# Patient Record
Sex: Female | Born: 1945 | Race: White | Hispanic: No | Marital: Married | Smoking: Former smoker
Health system: Southern US, Community
[De-identification: ages and names within clinical notes are randomized; demographics above are authoritative.]

## PROBLEM LIST (undated history)

## (undated) DIAGNOSIS — G4752 REM sleep behavior disorder: Secondary | ICD-10-CM

## (undated) DIAGNOSIS — I10 Essential (primary) hypertension: Secondary | ICD-10-CM

## (undated) HISTORY — PX: PARTIAL HIP ARTHROPLASTY: SHX733

## (undated) HISTORY — PX: HIP SURGERY: SHX245

---

## 2020-09-15 ENCOUNTER — Emergency Department (HOSPITAL_BASED_OUTPATIENT_CLINIC_OR_DEPARTMENT_OTHER)
Admission: EM | Admit: 2020-09-15 | Discharge: 2020-09-15 | Disposition: A | Discharge status: Home / Self Care | Payer: Medicare HMO | Attending: Emergency Medicine | Admitting: Emergency Medicine

## 2020-09-15 ENCOUNTER — Other Ambulatory Visit: Payer: Self-pay

## 2020-09-15 ENCOUNTER — Encounter (HOSPITAL_BASED_OUTPATIENT_CLINIC_OR_DEPARTMENT_OTHER): Payer: Self-pay

## 2020-09-15 ENCOUNTER — Emergency Department (HOSPITAL_BASED_OUTPATIENT_CLINIC_OR_DEPARTMENT_OTHER): Payer: Medicare HMO

## 2020-09-15 DIAGNOSIS — I10 Essential (primary) hypertension: Secondary | ICD-10-CM | POA: Insufficient documentation

## 2020-09-15 DIAGNOSIS — S92154A Nondisplaced avulsion fracture (chip fracture) of right talus, initial encounter for closed fracture: Secondary | ICD-10-CM | POA: Diagnosis not present

## 2020-09-15 DIAGNOSIS — W07XXXA Fall from chair, initial encounter: Secondary | ICD-10-CM | POA: Insufficient documentation

## 2020-09-15 DIAGNOSIS — S99912A Unspecified injury of left ankle, initial encounter: Secondary | ICD-10-CM | POA: Diagnosis present

## 2020-09-15 DIAGNOSIS — Z87891 Personal history of nicotine dependence: Secondary | ICD-10-CM | POA: Insufficient documentation

## 2020-09-15 DIAGNOSIS — M25571 Pain in right ankle and joints of right foot: Secondary | ICD-10-CM | POA: Insufficient documentation

## 2020-09-15 HISTORY — DX: REM sleep behavior disorder: G47.52

## 2020-09-15 HISTORY — DX: Essential (primary) hypertension: I10

## 2020-09-15 NOTE — ED Triage Notes (Signed)
Pt states was trying to step on a chair to reach something on a shelf. States fell and injured right foot/ankle. Denies hitting head/LOC.

## 2020-09-15 NOTE — ED Provider Notes (Signed)
[facility] EMERGENCY DEPARTMENT Provider Note  CSN: [id number] Arrival date & time: 09/15/20 1143    History Chief Complaint  Patient presents with   Jamie Caldwell    Jamie Caldwell is a 75 y.o. female with no significant PMH reports she was on a chair trying to reach something on a high shelf when the chair tipped over and she fell twisting her R ankle/foot. Denies any head injury or LOC. No other complaints of pain. Moderate aching pain in dorsal R foot worse with movement.    Past Medical History:  Diagnosis Date   Hypertension    REM sleep behavior disorder       No family history on file.  Social History   Tobacco Use   Smoking status: Former    Types: Cigarettes   Smokeless tobacco: Never  Vaping Use   Vaping Use: Never used  Substance Use Topics   Alcohol use: Yes    Comment: occasional   Drug use: Never     Home Medications Prior to Admission medications   Not on File     Allergies    Ace inhibitors   Review of Systems   Review of Systems A comprehensive review of systems was completed and negative except as noted in HPI.    Physical Exam BP (!) 167/77 (BP Location: Right Arm)   Pulse 74   Temp 97.7 F (36.5 C) (Oral)   Resp 18   Ht 5\' 4"  (1.626 m)   Wt 68 kg   SpO2 99%   BMI 25.75 kg/m   Physical Exam Vitals and nursing note reviewed.  HENT:     Head: Normocephalic and atraumatic.     Nose: Nose normal.  Eyes:     Extraocular Movements: Extraocular movements intact.  Pulmonary:     Effort: Pulmonary effort is normal.  Musculoskeletal:        General: Tenderness (dorsal R foot, no tenderness over malleoli or base of 5th metatarsal) present. Normal range of motion.     Cervical back: Neck supple.  Skin:    Findings: No rash (on exposed skin).  Neurological:     Mental Status: She is alert and oriented to person, place, and time.  Psychiatric:        Mood and Affect: Mood normal.     ED Results / Procedures / Treatments    Labs (all labs ordered are listed, but only abnormal results are displayed) Labs Reviewed - No data to display  EKG None   Radiology DG Ankle Complete Right  Result Date: 09/15/2020 CLINICAL DATA:  Fall, ankle injury EXAM: RIGHT ANKLE - COMPLETE 3+ VIEW; RIGHT FOOT COMPLETE - 3+ VIEW COMPARISON:  None. FINDINGS: Suspect a small avulsion fracture of the dorsal talus seen on lateral view. No other evidence of fracture or dislocation of the right foot or ankle. Joint spaces are preserved. Soft tissue edema of the anterior ankle. IMPRESSION: 1. Suspect a small avulsion fracture of the dorsal talus seen on lateral view. 2. No other evidence of fracture or dislocation of the right foot or ankle. 3.  Soft tissue edema of the anterior ankle. Electronically Signed   By: 11/15/2020 M.D.   On: 09/15/2020 12:46   DG Foot Complete Right  Result Date: 09/15/2020 CLINICAL DATA:  Fall, ankle injury EXAM: RIGHT ANKLE - COMPLETE 3+ VIEW; RIGHT FOOT COMPLETE - 3+ VIEW COMPARISON:  None. FINDINGS: Suspect a small avulsion fracture of the dorsal talus seen on lateral view.  No other evidence of fracture or dislocation of the right foot or ankle. Joint spaces are preserved. Soft tissue edema of the anterior ankle. IMPRESSION: 1. Suspect a small avulsion fracture of the dorsal talus seen on lateral view. 2. No other evidence of fracture or dislocation of the right foot or ankle. 3.  Soft tissue edema of the anterior ankle. Electronically Signed   By: Lauralyn Primes M.D.   On: 09/15/2020 12:46    Procedures Procedures  Medications Ordered in the ED Medications - No data to display   MDM Rules/Calculators/A&P MDM Xray images reviewed, tender over the area of concern for talar avulsion fracture. CAM walker for comfort and mobility. APAP for pain. Ortho follow up.   ED Course  I have reviewed the triage vital signs and the nursing notes.  Pertinent labs & imaging results that were available during my care of the  patient were reviewed by me and considered in my medical decision making (see chart for details).     Final Clinical Impression(s) / ED Diagnoses Final diagnoses:  Closed nondisplaced avulsion fracture of right talus, initial encounter    Rx / DC Orders ED Discharge Orders     None        Pollyann Savoy, MD 09/15/20 1308

## 2021-04-04 ENCOUNTER — Encounter (HOSPITAL_BASED_OUTPATIENT_CLINIC_OR_DEPARTMENT_OTHER): Payer: Self-pay

## 2021-04-04 ENCOUNTER — Emergency Department (HOSPITAL_BASED_OUTPATIENT_CLINIC_OR_DEPARTMENT_OTHER): Payer: Medicare HMO

## 2021-04-04 ENCOUNTER — Other Ambulatory Visit: Payer: Self-pay

## 2021-04-04 ENCOUNTER — Emergency Department (HOSPITAL_BASED_OUTPATIENT_CLINIC_OR_DEPARTMENT_OTHER)
Admission: EM | Admit: 2021-04-04 | Discharge: 2021-04-04 | Disposition: A | Discharge status: Home / Self Care | Payer: Medicare HMO | Attending: Emergency Medicine | Admitting: Emergency Medicine

## 2021-04-04 DIAGNOSIS — M25552 Pain in left hip: Secondary | ICD-10-CM | POA: Diagnosis present

## 2021-04-04 DIAGNOSIS — W19XXXA Unspecified fall, initial encounter: Secondary | ICD-10-CM | POA: Diagnosis not present

## 2021-04-04 DIAGNOSIS — I10 Essential (primary) hypertension: Secondary | ICD-10-CM | POA: Insufficient documentation

## 2021-04-04 DIAGNOSIS — R42 Dizziness and giddiness: Secondary | ICD-10-CM | POA: Insufficient documentation

## 2021-04-04 LAB — CBC WITH DIFFERENTIAL/PLATELET
Abs Immature Granulocytes: 0.02 10*3/uL (ref 0.00–0.07)
Basophils Absolute: 0 10*3/uL (ref 0.0–0.1)
Basophils Relative: 1 %
Eosinophils Absolute: 0.2 10*3/uL (ref 0.0–0.5)
Eosinophils Relative: 2 %
HCT: 41.4 % (ref 36.0–46.0)
Hemoglobin: 13.9 g/dL (ref 12.0–15.0)
Immature Granulocytes: 0 %
Lymphocytes Relative: 33 %
Lymphs Abs: 2.3 10*3/uL (ref 0.7–4.0)
MCH: 31.6 pg (ref 26.0–34.0)
MCHC: 33.6 g/dL (ref 30.0–36.0)
MCV: 94.1 fL (ref 80.0–100.0)
Monocytes Absolute: 0.6 10*3/uL (ref 0.1–1.0)
Monocytes Relative: 8 %
Neutro Abs: 3.9 10*3/uL (ref 1.7–7.7)
Neutrophils Relative %: 56 %
Platelets: 287 10*3/uL (ref 150–400)
RBC: 4.4 MIL/uL (ref 3.87–5.11)
RDW: 13.3 % (ref 11.5–15.5)
WBC: 6.9 10*3/uL (ref 4.0–10.5)
nRBC: 0 % (ref 0.0–0.2)

## 2021-04-04 LAB — COMPREHENSIVE METABOLIC PANEL
ALT: 15 U/L (ref 0–44)
AST: 22 U/L (ref 15–41)
Albumin: 3.6 g/dL (ref 3.5–5.0)
Alkaline Phosphatase: 72 U/L (ref 38–126)
Anion gap: 8 (ref 5–15)
BUN: 15 mg/dL (ref 8–23)
CO2: 28 mmol/L (ref 22–32)
Calcium: 9.2 mg/dL (ref 8.9–10.3)
Chloride: 103 mmol/L (ref 98–111)
Creatinine, Ser: 0.92 mg/dL (ref 0.44–1.00)
GFR, Estimated: >60 mL/min (ref >60)
Glucose, Bld: 104 mg/dL — ABNORMAL HIGH (ref 70–99)
Potassium: 3.3 mmol/L — ABNORMAL LOW (ref 3.5–5.1)
Sodium: 139 mmol/L (ref 135–145)
Total Bilirubin: 0.7 mg/dL (ref 0.3–1.2)
Total Protein: 6.5 g/dL (ref 6.5–8.1)

## 2021-04-04 LAB — URINALYSIS, ROUTINE W REFLEX MICROSCOPIC
Glucose, UA: NEGATIVE mg/dL
Hgb urine dipstick: NEGATIVE
Ketones, ur: NEGATIVE mg/dL
Nitrite: NEGATIVE
Protein, ur: 30 mg/dL — AB
Specific Gravity, Urine: 1.02 (ref 1.005–1.030)
pH: 6 (ref 5.0–8.0)

## 2021-04-04 LAB — URINALYSIS, MICROSCOPIC (REFLEX)

## 2021-04-04 MED ORDER — SODIUM CHLORIDE 0.9 % IV SOLN: INTRAVENOUS | Status: DC

## 2021-04-04 MED ORDER — SODIUM CHLORIDE 0.9 % IV BOLUS
1000.0000 mL | Freq: Once | INTRAVENOUS | Status: AC
  Administered 2021-04-04: 1000 mL via INTRAVENOUS

## 2021-04-04 NOTE — Discharge Instructions (Addendum)
Ask your primary care provider for a referral to a neurologist within the [facility] system.  Use compression hose to help keep blood pressure stable.

## 2021-04-04 NOTE — ED Notes (Signed)
Patient transported to CT/XRAY 

## 2021-04-04 NOTE — ED Provider Notes (Signed)
[facility] EMERGENCY DEPARTMENT Provider Note   CSN: [id number] Arrival date & time: 04/04/21  1143     History  Chief Complaint  Patient presents with   Marletta Lor    Jamie Caldwell is a 76 y.o. female.  Pt is a 76 yo wf with a hx of htn.  Pt has also had a hx of dizziness with intermittent falls.  She has seen cardiology and had a zio patch and echo eval which have been normal.  Pt had a fall in November which resulted in a left hip fx.  Pt said she gets these "spells" where she feels lightheaded.  She normally can try to get to a chair to lie down. Sx better after she sits.  Last night, she felt like she was going to pass out again.  She thought the floor was closer than it was and when she went to sit down, she misjudged the distance and fell again on her hip.  She is worried she injured some of the hardware.  Pt's pcp is aware of this dizziness and has encouraged her to use her cane and to continue with PT.       Home Medications Prior to Admission medications   Not on File      Allergies    Ace inhibitors    Review of Systems   Review of Systems  Musculoskeletal:        Left hip pain  Neurological:  Positive for dizziness.  All other systems reviewed and are negative.  Physical Exam Updated Vital Signs BP (!) 148/71    Pulse 73    Temp 97.7 F (36.5 C) (Oral)    Resp 15    Ht 5\' 4"  (1.626 m)    Wt 68 kg    SpO2 100%    BMI 25.75 kg/m  Physical Exam Vitals and nursing note reviewed.  Constitutional:      Appearance: Normal appearance.  HENT:     Head: Normocephalic and atraumatic.     Right Ear: External ear normal.     Left Ear: External ear normal.     Nose: Nose normal.     Mouth/Throat:     Mouth: Mucous membranes are moist.     Pharynx: Oropharynx is clear.  Eyes:     Extraocular Movements: Extraocular movements intact.     Conjunctiva/sclera: Conjunctivae normal.     Pupils: Pupils are equal, round, and reactive to light.  Cardiovascular:      Rate and Rhythm: Normal rate and regular rhythm.     Pulses: Normal pulses.     Heart sounds: Normal heart sounds.  Pulmonary:     Effort: Pulmonary effort is normal.     Breath sounds: Normal breath sounds.  Abdominal:     General: Abdomen is flat. Bowel sounds are normal.     Palpations: Abdomen is soft.  Musculoskeletal:        General: Normal range of motion.     Cervical back: Normal range of motion and neck supple.  Skin:    General: Skin is warm.     Capillary Refill: Capillary refill takes less than 2 seconds.  Neurological:     General: No focal deficit present.     Mental Status: She is alert and oriented to person, place, and time.  Psychiatric:        Mood and Affect: Mood normal.        Behavior: Behavior normal.    ED Results /  Procedures / Treatments   Labs (all labs ordered are listed, but only abnormal results are displayed) Labs Reviewed  COMPREHENSIVE METABOLIC PANEL - Abnormal; Notable for the following components:      Result Value   Potassium 3.3 (*)    Glucose, Bld 104 (*)    All other components within normal limits  URINALYSIS, ROUTINE W REFLEX MICROSCOPIC - Abnormal; Notable for the following components:   Color, Urine AMBER (*)    Bilirubin Urine SMALL (*)    Protein, ur 30 (*)    Leukocytes,Ua SMALL (*)    All other components within normal limits  URINALYSIS, MICROSCOPIC (REFLEX) - Abnormal; Notable for the following components:   Bacteria, UA FEW (*)    All other components within normal limits  CBC WITH DIFFERENTIAL/PLATELET  CBG MONITORING, ED    EKG EKG Interpretation  Date/Time:  Thursday April 04 2021 12:56:01 EST Ventricular Rate:  76 PR Interval:  170 QRS Duration: 84 QT Interval:  426 QTC Calculation: 479 R Axis:   -5 Text Interpretation: Sinus rhythm Probable left atrial enlargement Minimal ST depression, lateral leads No old tracing to compare Confirmed by Jacalyn Lefevre [id number]) on 04/04/2021 1:36:16 PM  Radiology DG  Chest 2 View  Result Date: 04/04/2021 CLINICAL DATA:  Fall. EXAM: CHEST - 2 VIEW COMPARISON:  Chest radiographs 09/24/2017 FINDINGS: The cardiomediastinal silhouette is unchanged with normal heart size. Aortic atherosclerosis is noted. The lungs are hyperinflated with evidence of underlying emphysema. No airspace consolidation, edema, pleural effusion, or pneumothorax is identified. There are mild chronic compression fractures in the midthoracic spine. IMPRESSION: No acute cardiopulmonary process. Electronically Signed   By: Sebastian Ache M.D.   On: 04/04/2021 13:52   CT HEAD WO CONTRAST  Result Date: 04/04/2021 CLINICAL DATA:  Head trauma, minor (Age >= 65y) EXAM: CT HEAD WITHOUT CONTRAST TECHNIQUE: Contiguous axial images were obtained from the base of the skull through the vertex without intravenous contrast. RADIATION DOSE REDUCTION: This exam was performed according to the departmental dose-optimization program which includes automated exposure control, adjustment of the mA and/or kV according to patient size and/or use of iterative reconstruction technique. COMPARISON:  01/06/2021 FINDINGS: Brain: No evidence of acute infarction, hemorrhage, hydrocephalus, extra-axial collection or mass lesion/mass effect. Moderate low-density changes within the periventricular and subcortical white matter compatible with chronic microvascular ischemic change. Mild diffuse cerebral volume loss. Vascular: No hyperdense vessel or unexpected calcification. Skull: Normal. Negative for fracture or focal lesion. Sinuses/Orbits: No acute finding. Other: Negative for scalp hematoma. IMPRESSION: 1. No acute intracranial abnormality. 2. Chronic microvascular ischemic change and cerebral volume loss. Electronically Signed   By: Duanne Guess D.O.   On: 04/04/2021 13:22   DG Hip Unilat W or Wo Pelvis 2-3 Views Left  Result Date: 04/04/2021 CLINICAL DATA:  Hip surgery EXAM: DG HIP (WITH OR WITHOUT PELVIS) 2-3V LEFT COMPARISON:   01/16/2021 FINDINGS: Internal fixation of the LEFT femoral neck with cortical screws. No acute fracture or dislocation. IMPRESSION: Internal fixation LEFT hip.  No acute fracture dislocation. Electronically Signed   By: Genevive Bi M.D.   On: 04/04/2021 14:03    Procedures Procedures    Medications Ordered in ED Medications  sodium chloride 0.9 % bolus 1,000 mL (1,000 mLs Intravenous New Bag/Given 04/04/21 1250)    And  0.9 %  sodium chloride infusion (0 mLs Intravenous Hold 04/04/21 1255)    ED Course/ Medical Decision Making/ A&P  Medical Decision Making Amount and/or Complexity of Data Reviewed Labs: ordered. Radiology: ordered. ECG/medicine tests: ordered.  Risk Prescription drug management.   This patient presents to the ED for concern of syncope, hip pain, this involves an extensive number of treatment options, and is a complaint that carries with it a high risk of complications and morbidity.  The differential diagnosis includes cardiac abn, traumatic injury   Co morbidities that complicate the patient evaluation  htn   Additional history obtained:  Additional history obtained from epic chart review External records from outside source obtained and reviewed including husband   Lab Tests:  I Ordered, and personally interpreted labs.  The pertinent results include:  cbc is nl, cmp nl, ua nl   Imaging Studies ordered:  I ordered imaging studies including ct head, xray hip and chest  I independently visualized and interpreted imaging which showed ct head nl. I agree with the radiologist interpretation   Cardiac Monitoring:  The patient was maintained on a cardiac monitor.  I personally viewed and interpreted the cardiac monitored which showed an underlying rhythm of: nsr   Medicines ordered and prescription drug management:  I ordered medication including IVFs  for orthostatics  Reevaluation of the patient after these  medicines showed that the patient improved I have reviewed the patients home medicines and have made adjustments as needed   Problem List / ED Course:  Syncope:  Likely orthostatic as she can feel it coming on.  She has seen cards, but has never seen neurology.  She would prefer to stay in the [facility] system so will ask pcp for a referral.  She has a walker and is encouraged to use it. Hip pain:  no fx.  Hardware intact.   Reevaluation:  After the interventions noted above, I reevaluated the patient and found that they have :improved   Dispostion:  After consideration of the diagnostic results and the patients response to treatment, I feel that the patent would benefit from discharge with outpatient f/u.  She is to return if worse.  F/u with pcp.        Final Clinical Impression(s) / ED Diagnoses Final diagnoses:  Fall, initial encounter    Rx / DC Orders ED Discharge Orders     None         Jacalyn Lefevre, MD 04/04/21 1428

## 2021-04-04 NOTE — ED Triage Notes (Addendum)
Pt states she fell last night-pain to left hip-states she had hip surgery 11/31/2022 and is concerned she may have "messed something up"-NAD-steady gait

## 2021-04-13 ENCOUNTER — Emergency Department (HOSPITAL_BASED_OUTPATIENT_CLINIC_OR_DEPARTMENT_OTHER): Payer: Medicare HMO

## 2021-04-13 ENCOUNTER — Emergency Department (HOSPITAL_BASED_OUTPATIENT_CLINIC_OR_DEPARTMENT_OTHER)
Admission: EM | Admit: 2021-04-13 | Discharge: 2021-04-13 | Disposition: A | Discharge status: Home / Self Care | Payer: Medicare HMO | Attending: Emergency Medicine | Admitting: Emergency Medicine

## 2021-04-13 ENCOUNTER — Other Ambulatory Visit: Payer: Self-pay

## 2021-04-13 ENCOUNTER — Encounter (HOSPITAL_BASED_OUTPATIENT_CLINIC_OR_DEPARTMENT_OTHER): Payer: Self-pay | Admitting: Emergency Medicine

## 2021-04-13 DIAGNOSIS — R55 Syncope and collapse: Secondary | ICD-10-CM | POA: Diagnosis not present

## 2021-04-13 LAB — BASIC METABOLIC PANEL
Anion gap: 8 (ref 5–15)
BUN: 16 mg/dL (ref 8–23)
CO2: 28 mmol/L (ref 22–32)
Calcium: 8.6 mg/dL — ABNORMAL LOW (ref 8.9–10.3)
Chloride: 103 mmol/L (ref 98–111)
Creatinine, Ser: 1.1 mg/dL — ABNORMAL HIGH (ref 0.44–1.00)
GFR, Estimated: 52 mL/min — ABNORMAL LOW (ref >60)
Glucose, Bld: 121 mg/dL — ABNORMAL HIGH (ref 70–99)
Potassium: 3.6 mmol/L (ref 3.5–5.1)
Sodium: 139 mmol/L (ref 135–145)

## 2021-04-13 LAB — CBC
HCT: 39.7 % (ref 36.0–46.0)
Hemoglobin: 13.4 g/dL (ref 12.0–15.0)
MCH: 31.7 pg (ref 26.0–34.0)
MCHC: 33.8 g/dL (ref 30.0–36.0)
MCV: 93.9 fL (ref 80.0–100.0)
Platelets: 223 10*3/uL (ref 150–400)
RBC: 4.23 MIL/uL (ref 3.87–5.11)
RDW: 13.4 % (ref 11.5–15.5)
WBC: 5.4 10*3/uL (ref 4.0–10.5)
nRBC: 0 % (ref 0.0–0.2)

## 2021-04-13 LAB — CBG MONITORING, ED: Glucose-Capillary: 129 mg/dL — ABNORMAL HIGH (ref 70–99)

## 2021-04-13 MED ORDER — SODIUM CHLORIDE 0.9 % IV BOLUS
1000.0000 mL | Freq: Once | INTRAVENOUS | Status: AC
  Administered 2021-04-13: 1000 mL via INTRAVENOUS

## 2021-04-13 NOTE — ED Notes (Signed)
Pt unable to urinate at this time.  

## 2021-04-13 NOTE — Discharge Instructions (Signed)
Please let your family doctor and your neurologist know that you had another event that brought you into the emergency department.  Her work-up here did not show any significant issues, no concerning finding on your EKG.  Please return for worsening or persistent symptoms.  Try to eat and drink as well as you can for the next 48 hours. ?

## 2021-04-13 NOTE — ED Provider Notes (Signed)
?[facility] EMERGENCY DEPARTMENT ?Provider Note ? ? ?CSN: [id number] ?Arrival date & time: 04/13/21  1459 ? ?  ? ?History ? ?Chief Complaint  ?Patient presents with  ? Loss of Consciousness  ? ? ?Jamie Caldwell is a 76 y.o. female. ? ?76 yO F with a chief complaint of a syncopal event.  The patient has had at least 3 of these in the past few months.  She has had a Zio patch as well as an echo.  She tells me nobody can find anything wrong with her.  She has an upcoming appointment with a neurologist in a couple months to assess from their standpoint.  She tells me that today she did not feel very well and she was at the grocery store and she suddenly felt like she was going to pass out and she continued to unload the groceries and then she did collapse.  She did strike her head but does not feel like she hurt it severely.  She denied headache neck pain chest pain or shortness of breath prior to the event.  She denies decreased oral intake but does state that she drinks less than she should she denies nausea or vomiting denies abdominal pain.  Denies any recent medication changes.  Denies dark stool or blood in her stool. ? ? ?Loss of Consciousness ? ?  ? ?Home Medications ?Prior to Admission medications   ?Not on File  ?   ? ?Allergies    ?Ace inhibitors   ? ?Review of Systems   ?Review of Systems  ?Cardiovascular:  Positive for syncope.  ? ?Physical Exam ?Updated Vital Signs ?BP (!) 191/87   Pulse 67   Temp 97.6 ?F (36.4 ?C) (Oral)   Resp 15   Ht 5\' 4"  (1.626 m)   Wt 63.5 kg   SpO2 100%   BMI 24.03 kg/m?  ?Physical Exam ?Vitals and nursing note reviewed.  ?Constitutional:   ?   General: She is not in acute distress. ?   Appearance: She is well-developed. She is not diaphoretic.  ?HENT:  ?   Head: Normocephalic and atraumatic.  ?Eyes:  ?   Pupils: Pupils are equal, round, and reactive to light.  ?Cardiovascular:  ?   Rate and Rhythm: Normal rate and regular rhythm.  ?   Heart sounds: No murmur heard. ?   No friction rub. No gallop.  ?Pulmonary:  ?   Effort: Pulmonary effort is normal.  ?   Breath sounds: No wheezing or rales.  ?Abdominal:  ?   General: There is no distension.  ?   Palpations: Abdomen is soft.  ?   Tenderness: There is no abdominal tenderness.  ?Musculoskeletal:     ?   General: No tenderness.  ?   Cervical back: Normal range of motion and neck supple.  ?Skin: ?   General: Skin is warm and dry.  ?Neurological:  ?   Mental Status: She is alert and oriented to person, place, and time.  ?Psychiatric:     ?   Behavior: Behavior normal.  ? ? ?ED Results / Procedures / Treatments   ?Labs ?(all labs ordered are listed, but only abnormal results are displayed) ?Labs Reviewed  ?BASIC METABOLIC PANEL - Abnormal; Notable for the following components:  ?    Result Value  ? Glucose, Bld 121 (*)   ? Creatinine, Ser 1.10 (*)   ? Calcium 8.6 (*)   ? GFR, Estimated 52 (*)   ? All other components  within normal limits  ?CBG MONITORING, ED - Abnormal; Notable for the following components:  ? Glucose-Capillary 129 (*)   ? All other components within normal limits  ?CBC  ?URINALYSIS, ROUTINE W REFLEX MICROSCOPIC  ? ? ?EKG ?EKG Interpretation ? ?Date/Time:  Saturday April 13 2021 15:12:55 EST ?Ventricular Rate:  91 ?PR Interval:  158 ?QRS Duration: 78 ?QT Interval:  370 ?QTC Calculation: 455 ?R Axis:   47 ?Text Interpretation: Normal sinus rhythm Nonspecific ST abnormality Abnormal ECG No significant change since last tracing Confirmed by Melene Plan [phone]) on 04/13/2021 3:58:36 PM ? ?Radiology ?CT Head Wo Contrast ? ?Result Date: 04/13/2021 ?CLINICAL DATA:  Head trauma, minor (Age >= 65y) EXAM: CT HEAD WITHOUT CONTRAST TECHNIQUE: Contiguous axial images were obtained from the base of the skull through the vertex without intravenous contrast. RADIATION DOSE REDUCTION: This exam was performed according to the departmental dose-optimization program which includes automated exposure control, adjustment of the mA and/or kV  according to patient size and/or use of iterative reconstruction technique. COMPARISON:  April 04, 2021 FINDINGS: Brain: No evidence of acute infarction, hemorrhage, hydrocephalus, extra-axial collection or mass lesion/mass effect. Periventricular white matter hypodensities consistent with sequela of chronic microvascular ischemic disease. Vascular: Vascular calcifications of the carotid siphons. Skull: Normal. Negative for fracture or focal lesion. Sinuses/Orbits: No acute finding. Other: None. IMPRESSION: No acute intracranial abnormality. Electronically Signed   By: Meda Klinefelter M.D.   On: 04/13/2021 16:48   ? ?Procedures ?.1-3 Lead EKG Interpretation ?Performed by: Melene Plan, DO ?Authorized by: Melene Plan, DO  ? ?  Interpretation: normal   ?  ECG rate:  71 ?  ECG rate assessment: normal   ?  Rhythm: sinus rhythm   ?  Ectopy: none   ?  Conduction: normal    ? ? ? ? ?Medications Ordered in ED ?Medications  ?sodium chloride 0.9 % bolus 1,000 mL (1,000 mLs Intravenous New Bag/Given 04/13/21 1654)  ? ? ?ED Course/ Medical Decision Making/ A&P ?  ?                        ?Medical Decision Making ?Amount and/or Complexity of Data Reviewed ?Labs: ordered. ?Radiology: ordered. ? ? ?Patient is a 76 y.o. female with a cc of syncopal event.  Patient has had now 3 of these in the past few months.  On record review she has multiple PCP notes including 1 from last month where they document that she has had dizziness for quite some time and has had episodes where she is lost her balance and collapsed to the ground.  She has had an evaluation for cardiogenic syncope multiple times.  Has not found concerning etiology.  Work-up here is largely unremarkable no significant electrolyte abnormality no significant anemia.  EKG without concerning finding.  No obvious finding of ectopy on her cardiac monitoring while in the emergency department.  I find little utility in admitting to her to the hospital with already having  outpatient telemetry monitoring and an echocardiogram.  We will have her follow-up with her family doctor in the office. ? ?5:26 PM:  I have discussed the diagnosis/risks/treatment options with the patient and family.  Evaluation and diagnostic testing in the emergency department does not suggest an emergent condition requiring admission or immediate intervention beyond what has been performed at this time.  They will follow up with  PCP. We also discussed returning to the ED immediately if new or worsening sx occur. We discussed  the sx which are most concerning (e.g., sudden worsening pain, fever, inability to tolerate by mouth, chest pain, trouble breathing, headache) that necessitate immediate return. Medications administered to the patient during their visit and any new prescriptions provided to the patient are listed below. ? ?Medications given during this visit ?Medications  ?sodium chloride 0.9 % bolus 1,000 mL (1,000 mLs Intravenous New Bag/Given 04/13/21 1654)  ? ? ? ?The patient appears reasonably screen and/or stabilized for discharge and I doubt any other medical condition or other [facility] requiring further screening, evaluation, or treatment in the ED at this time prior to discharge.  ? ? ? ? ? ? ? ? ? ?Final Clinical Impression(s) / ED Diagnoses ?Final diagnoses:  ?Syncope and collapse  ? ? ?Rx / DC Orders ?ED Discharge Orders   ? ? None  ? ?  ? ? ?  ?Melene Plan, DO ?04/13/21 1726 ? ?

## 2021-04-13 NOTE — ED Notes (Signed)
ED Provider at bedside and aware of pt BP ?

## 2021-04-13 NOTE — ED Notes (Signed)
ED Provider at bedside. Dr. Floyd ?

## 2021-04-13 NOTE — ED Triage Notes (Signed)
Pt arrives pov, ambulatory to triage c/o assisted fall and syncope approx. 45 mins pta. Pt denies thinners, denies neck pain, denies HA, pt is bilaterally equal. AOx4 ?

## 2021-04-13 NOTE — ED Notes (Signed)
Patient transported to CT 

## 2021-11-15 ENCOUNTER — Other Ambulatory Visit: Payer: Self-pay

## 2021-11-15 ENCOUNTER — Emergency Department (HOSPITAL_BASED_OUTPATIENT_CLINIC_OR_DEPARTMENT_OTHER): Payer: Medicare HMO

## 2021-11-15 ENCOUNTER — Emergency Department (HOSPITAL_BASED_OUTPATIENT_CLINIC_OR_DEPARTMENT_OTHER)
Admission: EM | Admit: 2021-11-15 | Discharge: 2021-11-15 | Disposition: A | Discharge status: Home / Self Care | Payer: Medicare HMO | Attending: Emergency Medicine | Admitting: Emergency Medicine

## 2021-11-15 ENCOUNTER — Encounter (HOSPITAL_BASED_OUTPATIENT_CLINIC_OR_DEPARTMENT_OTHER): Payer: Self-pay

## 2021-11-15 DIAGNOSIS — Y92003 Bedroom of unspecified non-institutional (private) residence as the place of occurrence of the external cause: Secondary | ICD-10-CM | POA: Insufficient documentation

## 2021-11-15 DIAGNOSIS — W06XXXA Fall from bed, initial encounter: Secondary | ICD-10-CM | POA: Insufficient documentation

## 2021-11-15 DIAGNOSIS — S0990XA Unspecified injury of head, initial encounter: Secondary | ICD-10-CM

## 2021-11-15 DIAGNOSIS — S0181XA Laceration without foreign body of other part of head, initial encounter: Secondary | ICD-10-CM | POA: Insufficient documentation

## 2021-11-15 MED ORDER — LIDOCAINE-EPINEPHRINE (PF) 2 %-1:200000 IJ SOLN
10.0000 mL | Freq: Once | INTRAMUSCULAR | Status: AC
  Administered 2021-11-15: 10 mL via INTRADERMAL
  Filled 2021-11-15: qty 20

## 2021-11-15 NOTE — ED Triage Notes (Signed)
Pt brought in by husband. Pt reports she missed the bed and fell in floor. Noted to have a laceration to right temporal area.  Pt S/P hip repair. And has completed 10 days of blood thinners

## 2021-11-15 NOTE — Discharge Instructions (Signed)
Please call your family doctor and let them know that he fell down.  This is something that could be a flag for them to see back in the office and reevaluate your medications.  Your head CT was normal it is very uncommon but sometimes people can have bleeding inside the skull after normal head CT.  If you develop headache confusion or vomiting then please return to the emergency department.  Take 8 scoops of miralax in 32oz of whatever you would like to drink.(Gatorade comes in this size) You can also use a fleets enema which you can buy over the counter at the pharmacy.  Return for worsening abdominal pain, vomiting or fever.  Return for redness drainage or if you get a fever.  The sutures that were used are dissolvable that should dissolve between day 3 and day 5.  If they are still there after day 7 then you can gently plucked them out with tweezers.  The area can get wet but not fully immersed underwater.  No scrubbing.  If you really want to clean it you can apply a half-and-half hydrogen peroxide solution with water on a Q-tip.  You can apply an ointment a couple times a day this could be as simple as Vaseline but could also be an antibiotic ointment if you wish..  Once it is healed please try to avoid prolonged sun exposure use sunscreen.  Gells that have silicone antigens have been shown to reduce scarring in some research.

## 2021-11-15 NOTE — ED Provider Notes (Signed)
[facility] EMERGENCY DEPARTMENT Provider Note   CSN: [id number] Arrival date & time: 11/15/21  0732     History  Chief Complaint  Patient presents with   Fall   Laceration    Jamie Caldwell is a 76 y.o. female.  76 yo F with a chief complaint of a fall.  The patient was getting back to her bed from the bathroom and she went to lay on the bed and she missed and struck her head on the ground.  She denies any other obvious injury.  Had recently suffered a hip fracture and had just finished her last dose of anticoagulation this week.  She denies any chest pain abdominal pain.  She has been suffering with constipation which is why she got up to go to the bathroom.  She feels a severe pain done in her rectum and has trouble sitting on her bottom.  Denies nausea or vomiting.  She is concerned it might be due to the vitamin E that she has been taking postsurgery.   Fall  Laceration      Home Medications Prior to Admission medications   Not on File      Allergies    Ace inhibitors    Review of Systems   Review of Systems  Physical Exam Updated Vital Signs BP (!) 180/66   Pulse 85   Temp 97.9 F (36.6 C) (Oral)   Resp 18   SpO2 96%  Physical Exam Vitals and nursing note reviewed.  Constitutional:      General: She is not in acute distress.    Appearance: She is well-developed. She is not diaphoretic.  HENT:     Head: Normocephalic.     Comments: Laceration to the left temple. Eyes:     Pupils: Pupils are equal, round, and reactive to light.  Cardiovascular:     Rate and Rhythm: Normal rate and regular rhythm.     Heart sounds: No murmur heard.    No friction rub. No gallop.  Pulmonary:     Effort: Pulmonary effort is normal.     Breath sounds: No wheezing or rales.  Abdominal:     General: There is no distension.     Palpations: Abdomen is soft.     Tenderness: There is no abdominal tenderness.  Genitourinary:    Comments: Fairly large amounts of  hard stool in the vault.  No gross blood. Musculoskeletal:        General: No tenderness.     Cervical back: Normal range of motion and neck supple.  Skin:    General: Skin is warm and dry.  Neurological:     Mental Status: She is alert and oriented to person, place, and time.  Psychiatric:        Behavior: Behavior normal.     ED Results / Procedures / Treatments   Labs (all labs ordered are listed, but only abnormal results are displayed) Labs Reviewed - No data to display  EKG None  Radiology CT Head Wo Contrast  Result Date: 11/15/2021 CLINICAL DATA:  Trauma EXAM: CT HEAD WITHOUT CONTRAST TECHNIQUE: Contiguous axial images were obtained from the base of the skull through the vertex without intravenous contrast. RADIATION DOSE REDUCTION: This exam was performed according to the departmental dose-optimization program which includes automated exposure control, adjustment of the mA and/or kV according to patient size and/or use of iterative reconstruction technique. COMPARISON:  Head CT dated April 13, 2021 FINDINGS: Brain: Chronic white matter ischemic  change. No evidence of acute infarction, hemorrhage, hydrocephalus, extra-axial collection or mass lesion/mass effect. Vascular: No hyperdense vessel or unexpected calcification. Skull: Normal. Negative for fracture or focal lesion. Sinuses/Orbits: No acute finding. Other: None. IMPRESSION: No acute intracranial abnormality. Electronically Signed   By: Allegra Lai M.D.   On: 11/15/2021 08:18    Procedures Fecal disimpaction  Date/Time: 11/15/2021 7:56 AM  Performed by: Melene Plan, DO Authorized by: Melene Plan, DO  Consent: Verbal consent obtained. Risks and benefits: risks, benefits and alternatives were discussed Consent given by: patient Patient understanding: patient states understanding of the procedure being performed Imaging studies: imaging studies not available Required items: required blood products, implants, devices,  and special equipment available Patient identity confirmed: verbally with patient Time out: Immediately prior to procedure a "time out" was called to verify the correct patient, procedure, equipment, support staff and site/side marked as required. Local anesthesia used: no  Anesthesia: Local anesthesia used: no  Sedation: Patient sedated: no  Patient tolerance: patient tolerated the procedure well with no immediate complications   .Marland KitchenLaceration Repair  Date/Time: 11/15/2021 8:46 AM  Performed by: Melene Plan, DO Authorized by: Melene Plan, DO   Consent:    Consent obtained:  Verbal   Consent given by:  Patient and spouse   Risks, benefits, and alternatives were discussed: yes     Risks discussed:  Infection, pain and poor wound healing   Alternatives discussed:  No treatment and delayed treatment Universal protocol:    Procedure explained and questions answered to patient or proxy's satisfaction: yes     Imaging studies available: yes (CT)     Immediately prior to procedure, a time out was called: yes     Patient identity confirmed:  Verbally with patient Anesthesia:    Anesthesia method:  Local infiltration   Local anesthetic:  Lidocaine 2% WITH epi Laceration details:    Location:  Face   Face location:  Forehead   Length (cm):  2.7 Pre-procedure details:    Preparation:  Imaging obtained to evaluate for foreign bodies Exploration:    Limited defect created (wound extended): no     Hemostasis achieved with:  Epinephrine and direct pressure   Imaging obtained comment:  CT   Imaging outcome: foreign body not noted     Wound exploration: entire depth of wound visualized     Wound extent: no underlying fracture noted     Contaminated: no   Treatment:    Area cleansed with:  Saline   Amount of cleaning:  Standard   Irrigation solution:  Sterile saline   Irrigation volume:  40   Irrigation method:  Syringe and pressure wash   Visualized foreign bodies/material removed:  no     Debridement:  None   Undermining:  None   Scar revision: no   Skin repair:    Repair method:  Sutures   Suture size:  5-0   Suture material:  Fast-absorbing gut   Suture technique:  Simple interrupted   Number of sutures:  3 Approximation:    Approximation:  Close Repair type:    Repair type:  Simple Post-procedure details:    Dressing:  Adhesive bandage and antibiotic ointment   Procedure completion:  Tolerated well, no immediate complications     Medications Ordered in ED Medications  lidocaine-EPINEPHrine (XYLOCAINE W/EPI) 2 %-1:200000 (PF) injection 10 mL (10 mLs Intradermal Given by Other 11/15/21 8101)    ED Course/ Medical Decision Making/ A&P  Medical Decision Making Amount and/or Complexity of Data Reviewed Radiology: ordered.  Risk Prescription drug management.   76 yo F with a chief complaint of a fall.  Nonsyncopal by history.  She struck the left side of her head and has a laceration.  Just finished anticoagulation therapy status post left hip repair.  She has a secondary complaints of constipation.  Found to have fecal impaction.  Disimpacted at bedside.  We will CT head.  Repair wound at bedside.  CT of the head independently reviewed by me without obvious intracranial hemorrhage.  Wound was repaired.  Will discharge home.  PCP follow-up.  8:47 AM:  I have discussed the diagnosis/risks/treatment options with the patient and family.  Evaluation and diagnostic testing in the emergency department does not suggest an emergent condition requiring admission or immediate intervention beyond what has been performed at this time.  They will follow up with PCP. We also discussed returning to the ED immediately if new or worsening sx occur. We discussed the sx which are most concerning (e.g., sudden worsening pain, fever, inability to tolerate by mouth) that necessitate immediate return. Medications administered to the patient during  their visit and any new prescriptions provided to the patient are listed below.  Medications given during this visit Medications  lidocaine-EPINEPHrine (XYLOCAINE W/EPI) 2 %-1:200000 (PF) injection 10 mL (10 mLs Intradermal Given by Other 11/15/21 0827)     The patient appears reasonably screen and/or stabilized for discharge and I doubt any other medical condition or other [facility] requiring further screening, evaluation, or treatment in the ED at this time prior to discharge.          Final Clinical Impression(s) / ED Diagnoses Final diagnoses:  Injury of head, initial encounter  Facial laceration, initial encounter    Rx / DC Orders ED Discharge Orders     None         Deno Etienne, DO 11/15/21 [phone]

## 2022-11-15 ENCOUNTER — Emergency Department (HOSPITAL_BASED_OUTPATIENT_CLINIC_OR_DEPARTMENT_OTHER)
Admission: EM | Admit: 2022-11-15 | Discharge: 2022-11-15 | Disposition: A | Discharge status: Home / Self Care | Payer: Medicare HMO | Attending: Emergency Medicine | Admitting: Emergency Medicine

## 2022-11-15 ENCOUNTER — Encounter (HOSPITAL_BASED_OUTPATIENT_CLINIC_OR_DEPARTMENT_OTHER): Payer: Self-pay | Admitting: Emergency Medicine

## 2022-11-15 ENCOUNTER — Emergency Department (HOSPITAL_BASED_OUTPATIENT_CLINIC_OR_DEPARTMENT_OTHER): Payer: Medicare HMO

## 2022-11-15 DIAGNOSIS — R030 Elevated blood-pressure reading, without diagnosis of hypertension: Secondary | ICD-10-CM | POA: Diagnosis not present

## 2022-11-15 DIAGNOSIS — M79604 Pain in right leg: Secondary | ICD-10-CM | POA: Insufficient documentation

## 2022-11-15 MED ORDER — TRAMADOL HCL 50 MG PO TABS
25.0000 mg | ORAL_TABLET | Freq: Four times a day (QID) | ORAL | 0 refills | Status: AC | PRN
Start: 2022-11-15 — End: ?

## 2022-11-15 NOTE — ED Triage Notes (Signed)
Pt reports she has a "knot" on her RT buttock; denies pain; her concern was that it is a blood clot

## 2022-11-15 NOTE — ED Provider Notes (Cosign Needed Addendum)
[place] EMERGENCY DEPARTMENT AT [facility] Provider Note   CSN: [id number] Arrival date & time: 11/15/22  1303     History  Chief Complaint  Patient presents with   Cyst    Jamie Caldwell is a 77 y.o. female.  Who presents emergency department with a chief complaint of leg pain.  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  She has been having pain in the leg intermittently since the replacement.  She states at times she is has been difficulty walking.  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  She is recently restarted on blood thinners.  She and her husband noticed a small "knot" just posterior to the femur were concerned she could have a blood clot.  It is mildly tender.  She has no other complaints at this time including weakness numbness tingling or loss of bowel or bladder function.  HPI     Home Medications Prior to Admission medications   Not on File      Allergies    Ace inhibitors    Review of Systems   Review of Systems  Physical Exam Updated Vital Signs BP (!) 202/110 (BP Location: Right Arm)   Pulse 72   Temp 97.7 F (36.5 C) (Oral)   Resp 15   Ht 5\' 4"  (1.626 m)   Wt 62.1 kg   SpO2 98%   BMI 23.52 kg/m  Physical Exam Vitals and nursing note reviewed.  Constitutional:      General: She is not in acute distress.    Appearance: She is well-developed. She is not diaphoretic.  HENT:     Head: Normocephalic and atraumatic.     Right Ear: External ear normal.     Left Ear: External ear normal.     Nose: Nose normal.     Mouth/Throat:     Mouth: Mucous membranes are moist.  Eyes:     General: No scleral icterus.    Conjunctiva/sclera: Conjunctivae normal.  Cardiovascular:     Rate and Rhythm: Normal rate and regular rhythm.     Heart sounds: Normal heart sounds. No murmur heard.    No friction rub. No gallop.  Pulmonary:     Effort: Pulmonary effort is normal. No respiratory distress.     Breath  sounds: Normal breath sounds.  Abdominal:     General: Bowel sounds are normal. There is no distension.     Palpations: Abdomen is soft. There is no mass.     Tenderness: There is no abdominal tenderness. There is no guarding.  Musculoskeletal:     Cervical back: Normal range of motion.     Comments: Tender nodule noted just posterior to the femur and the mid thigh region.  No heat redness.  Mildly tender, mobile.  Neurovascularly intact in the right leg, full range of motion and ambulatory  Skin:    General: Skin is warm and dry.  Neurological:     Mental Status: She is alert and oriented to person, place, and time.  Psychiatric:        Behavior: Behavior normal.     ED Results / Procedures / Treatments   Labs (all labs ordered are listed, but only abnormal results are displayed) Labs Reviewed - No data to display  EKG None  Radiology DG Hip Unilat W or Wo Pelvis 2-3 Views Right  Result Date: 11/15/2022 CLINICAL DATA:  Patient reports knot on right buttock.  EXAM: DG HIP (WITH OR WITHOUT PELVIS) 2-3V RIGHT COMPARISON:  None Available. FINDINGS: Right hip arthroplasty in expected alignment. No periprosthetic lucency or fracture. Heterotopic calcification about the greater trochanter. Bony pelvis is intact. Pubic rami are normal. Pubic symphysis is congruent. IMPRESSION: 1. Right hip arthroplasty without complication. 2. Heterotopic calcification about the right greater trochanter. Electronically Signed   By: Narda Rutherford M.D.   On: 11/15/2022 15:10    Procedures Procedures    Medications Ordered in ED Medications - No data to display  ED Course/ Medical Decision Making/ A&P                                 Medical Decision Making 77 year old female here with right hip pain that is chronic, tender area behind the right femur.  Symptoms are not consistent with a DVT.  She is able to ambulate, I doubt the space infection, abscess. Will have her follow closely with her  surgeon.  Patient was offered soft tissue ultrasound of the area however she declined at this time as her pain is improved.  This happened without intervention. Patient x-ray shows no acute findings \ Patient arrived normal tensive and is noted to be markedly hypertensive here this was checked several times including with a manual pressure.  She has no reported history of high blood pressure in the past.  Given these findings I think this is likely whitecoat hypertension.  Patient fit from very close follow-up for reevaluation of her blood pressure however do not think that starting her on some medication for hypertension with a single documented instance of elevated blood pressure would be prudent or safe at this time.  Patient is otherwise asymptomatic.  Will have her follow-up in outpatient setting discussed return precautions  Amount and/or Complexity of Data Reviewed Radiology: ordered and independent interpretation performed.  Risk Prescription drug management.           Final Clinical Impression(s) / ED Diagnoses Final diagnoses:  Pain of right leg  Elevated systolic blood pressure reading without diagnosis of hypertension    Rx / DC Orders ED Discharge Orders     None         Arthor Captain, PA-C 11/15/22 2327    Arthor Captain, PA-C 11/15/22 2334    Terrilee Files, MD 11/16/22 1020

## 2022-11-15 NOTE — Discharge Instructions (Addendum)
As discussed follow-up closely with your orthopedic surgeon for evaluation of your leg pain. Get your blood pressure rechecked on Monday.  Get help right away in the emergency department if you have any new or worsening concerns.  Get help right away if you: Develop a severe headache or confusion. Have unusual weakness or numbness. Feel faint. Have severe pain in your chest or abdomen. Vomit repeatedly. Have trouble breathing.

## 2023-02-10 ENCOUNTER — Other Ambulatory Visit: Payer: Self-pay

## 2023-02-10 ENCOUNTER — Encounter (HOSPITAL_BASED_OUTPATIENT_CLINIC_OR_DEPARTMENT_OTHER): Payer: Self-pay

## 2023-02-10 ENCOUNTER — Emergency Department (HOSPITAL_BASED_OUTPATIENT_CLINIC_OR_DEPARTMENT_OTHER): Payer: Medicare HMO

## 2023-02-10 ENCOUNTER — Emergency Department (HOSPITAL_BASED_OUTPATIENT_CLINIC_OR_DEPARTMENT_OTHER)
Admission: EM | Admit: 2023-02-10 | Discharge: 2023-02-10 | Disposition: A | Discharge status: Home / Self Care | Payer: Medicare HMO | Attending: Emergency Medicine | Admitting: Emergency Medicine

## 2023-02-10 DIAGNOSIS — S0003XA Contusion of scalp, initial encounter: Secondary | ICD-10-CM | POA: Diagnosis not present

## 2023-02-10 DIAGNOSIS — W19XXXA Unspecified fall, initial encounter: Secondary | ICD-10-CM

## 2023-02-10 DIAGNOSIS — Z96641 Presence of right artificial hip joint: Secondary | ICD-10-CM | POA: Insufficient documentation

## 2023-02-10 DIAGNOSIS — Z7901 Long term (current) use of anticoagulants: Secondary | ICD-10-CM | POA: Insufficient documentation

## 2023-02-10 DIAGNOSIS — Z96642 Presence of left artificial hip joint: Secondary | ICD-10-CM | POA: Diagnosis not present

## 2023-02-10 DIAGNOSIS — S0990XA Unspecified injury of head, initial encounter: Secondary | ICD-10-CM | POA: Diagnosis present

## 2023-02-10 DIAGNOSIS — W108XXA Fall (on) (from) other stairs and steps, initial encounter: Secondary | ICD-10-CM | POA: Insufficient documentation

## 2023-02-10 NOTE — ED Triage Notes (Signed)
 Pt arrives after having a fall today. Pt was getting out of her car and got her feet tangled up and fell and hit her head on a concrete wall. Pt does take eliquis. Pt has an abrasion on the left side of her head. No bleeding at this time. Pt denies neck or back pain.

## 2023-02-10 NOTE — ED Notes (Signed)
 ED Provider at bedside.

## 2023-02-10 NOTE — Discharge Instructions (Addendum)
You can use an ice pack on the top of your scalp where you have the goose egg for the next 2 days, for 10 minutes at a time on and off, to help with pain and swelling.  You can take Tylenol as needed for pain.

## 2023-02-10 NOTE — ED Provider Notes (Signed)
 [place] EMERGENCY DEPARTMENT AT [facility] Provider Note   CSN: [id number] Arrival date & time: 02/10/23  1637     History  Chief Complaint  Patient presents with   [place]    Jamie Caldwell is a 77 y.o. female with a history of DVT or PE, on Eliquis, history of dizziness and frequent falls, bilateral hip replacements, presenting from home in the company of her husband with a fall today.  Patient reports that she had gotten out of car and she was trying to walk of the 2 steps with her house, began to feel dizzy, and fell backwards.  She says she struck the back of her head on the ground.  No loss of consciousness.  She had a moderate headache but it is since improved.  She denies pain anywhere else.  She reports to me that she has frequent dizzy spells, often with standing up or moving around.  She says she has frequently fallen as a result.  This is an ongoing issue for her.  She was told that she has anemia she takes iron pills, reports she has had persistent black stool since then.    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  HPI     Home Medications Prior to Admission medications   Medication Sig Start Date End Date Taking? Authorizing Provider  traMADol  (ULTRAM ) 50 MG tablet Take 0.5-1 tablets (25-50 mg total) by mouth every 6 (six) hours as needed. 11/15/22   Harris, Abigail, PA-C      Allergies    Ace inhibitors    Review of Systems   Review of Systems  Physical Exam Updated Vital Signs BP 137/78 (BP Location: Left Arm)   Pulse 91   Temp 97.8 F (36.6 C) (Oral)   Resp 18   Wt 62.1 kg   SpO2 100%   BMI 23.52 kg/m  Physical Exam Constitutional:      General: She is not in acute distress. HENT:     Head: Normocephalic.     Comments: Superficial abrasion to the back of scalp with no gaping laceration Eyes:     Conjunctiva/sclera: Conjunctivae normal.      Pupils: Pupils are equal, round, and reactive to light.  Cardiovascular:     Rate and Rhythm: Normal rate and regular rhythm.  Pulmonary:     Effort: Pulmonary effort is normal. No respiratory distress.  Abdominal:     General: There is no distension.     Tenderness: There is no abdominal tenderness.  Skin:    General: Skin is warm and dry.  Neurological:     General: No focal deficit present.     Mental Status: She is alert. Mental status is at baseline.  Psychiatric:        Mood and Affect: Mood normal.        Behavior: Behavior normal.     ED Results / Procedures / Treatments   Labs (all labs ordered are listed, but only abnormal results are displayed) Labs Reviewed - No data to display   EKG None  Radiology CT Head Wo Contrast Result Date: 02/10/2023 CLINICAL DATA:  Fall EXAM: CT HEAD WITHOUT CONTRAST CT CERVICAL SPINE WITHOUT CONTRAST TECHNIQUE: Multidetector CT imaging of the head and cervical spine was performed following the standard protocol without intravenous contrast. Multiplanar CT image reconstructions of the cervical spine were also generated. RADIATION DOSE REDUCTION: This exam was  performed according to the departmental dose-optimization program which includes automated exposure control, adjustment of the mA and/or kV according to patient size and/or use of iterative reconstruction technique. COMPARISON:  None Available. FINDINGS: CT HEAD FINDINGS Brain: There is no mass, hemorrhage or extra-axial collection. There is generalized atrophy without lobar predilection. Hypodensity of the white matter is most commonly associated with chronic microvascular disease. Vascular: No hyperdense vessel or unexpected vascular calcification. Skull: The visualized skull base, calvarium and extracranial soft tissues are normal. Sinuses/Orbits: No fluid levels or advanced mucosal thickening of the visualized paranasal sinuses. No mastoid or middle ear effusion. Normal orbits. Other:  None. CT CERVICAL SPINE FINDINGS Alignment: No static subluxation. Facets are aligned. Occipital condyles are normally positioned. Skull base and vertebrae: No acute fracture. Soft tissues and spinal canal: No prevertebral fluid or swelling. No visible canal hematoma. Disc levels: No advanced spinal canal or neural foraminal stenosis. Upper chest: Biapical emphysema Other: Normal visualized paraspinal cervical soft tissues. IMPRESSION: 1. No acute intracranial abnormality. 2. No acute fracture or static subluxation of the cervical spine. Emphysema (ICD10-J43.9). Electronically Signed   By: Franky Stanford M.D.   On: 02/10/2023 19:43   CT Cervical Spine Wo Contrast Result Date: 02/10/2023 CLINICAL DATA:  Fall EXAM: CT HEAD WITHOUT CONTRAST CT CERVICAL SPINE WITHOUT CONTRAST TECHNIQUE: Multidetector CT imaging of the head and cervical spine was performed following the standard protocol without intravenous contrast. Multiplanar CT image reconstructions of the cervical spine were also generated. RADIATION DOSE REDUCTION: This exam was performed according to the departmental dose-optimization program which includes automated exposure control, adjustment of the mA and/or kV according to patient size and/or use of iterative reconstruction technique. COMPARISON:  None Available. FINDINGS: CT HEAD FINDINGS Brain: There is no mass, hemorrhage or extra-axial collection. There is generalized atrophy without lobar predilection. Hypodensity of the white matter is most commonly associated with chronic microvascular disease. Vascular: No hyperdense vessel or unexpected vascular calcification. Skull: The visualized skull base, calvarium and extracranial soft tissues are normal. Sinuses/Orbits: No fluid levels or advanced mucosal thickening of the visualized paranasal sinuses. No mastoid or middle ear effusion. Normal orbits. Other: None. CT CERVICAL SPINE FINDINGS Alignment: No static subluxation. Facets are aligned. Occipital  condyles are normally positioned. Skull base and vertebrae: No acute fracture. Soft tissues and spinal canal: No prevertebral fluid or swelling. No visible canal hematoma. Disc levels: No advanced spinal canal or neural foraminal stenosis. Upper chest: Biapical emphysema Other: Normal visualized paraspinal cervical soft tissues. IMPRESSION: 1. No acute intracranial abnormality. 2. No acute fracture or static subluxation of the cervical spine. Emphysema (ICD10-J43.9). Electronically Signed   By: Franky Stanford M.D.   On: 02/10/2023 19:43    Procedures Procedures    Medications Ordered in ED Medications - No data to display  ED Course/ Medical Decision Making/ A&P Clinical Course as of 02/10/23 2000  Tue Feb 10, 2023  1958 Patient is EKG per my review shows a normal sinus rhythm no acute ischemic findings or evidence of dangerous arrhythmia.  QTc is 446.  Problems of the EKG crossing over onto epic. [MT]    Clinical Course User Index [MT] Luvena Wentling, Donnice PARAS, MD                                 Medical Decision Making Amount and/or Complexity of Data Reviewed Labs: ordered. Radiology: ordered. ECG/medicine tests: ordered.   This patient presents to the ED  with concern for fall, dizziness.  Per my review of external records, patient is frequently seen by physical therapy.  She has gait instability issues and per her own recollection, this appears to be a consistently chronic issue today.  She has a macerated region to the occipital with no underlying hematoma or active bleeding.  This is not amenable to suturing or stapling.  I suspect it may use for the next 24 hours but not to stop bleeding well on its own.  Recommended ice to this area.  She is not having any chest pain to suggest ACS or pulmonary embolism.  Vital signs are unremarkable.  I do not believe the patient is needing blood testing at this time.  Low suspicion for acute anemia, dehydration, other life-threatening cause of  dizziness.  Co-morbidities that complicate the patient evaluation: Eliquis use at high risk of traumatic bleed  Additional history obtained from patient's husband at bedside  External records from outside source obtained and reviewed including outpatient physical therapy record   I ordered imaging studies including CT head and cervical spine I independently visualized and interpreted imaging which showed no emergent findings I agree with the radiologist interpretation  The patient was maintained on a cardiac monitor.  I personally viewed and interpreted the cardiac monitored which showed an underlying rhythm of: Sinus rhythm  Per my interpretation the patient's ECG shows sinus rhythm no acute ischemic findings  After the interventions noted above, I reevaluated the patient and found that they have: improved   Dispostion:  After consideration of the diagnostic results and the patients response to treatment, I feel that the patent would benefit from outpatient follow-up.  I did recommend the patient use a walker that she already has at home for additional gait stability.  Her husband is taking her home.  They are comfortable this plan        Final Clinical Impression(s) / ED Diagnoses Final diagnoses:  Fall, initial encounter  Contusion of scalp, initial encounter    Rx / DC Orders ED Discharge Orders     None         Kendel Pesnell, Donnice PARAS, MD 02/10/23 2000

## 2023-02-14 IMAGING — CT CT HEAD W/O CM
4 series · 16 of 47 positions shown, 18 images · non-contrast
Comparison: April 04, 2021

CLINICAL DATA: Head trauma, minor (Age >= 65y)



[Series 2: head wo · axial · 0.39mm/px · z∈[-221,-106]mm · 7 of 31 slices shown, 9 images]
[im 4/31  brain]
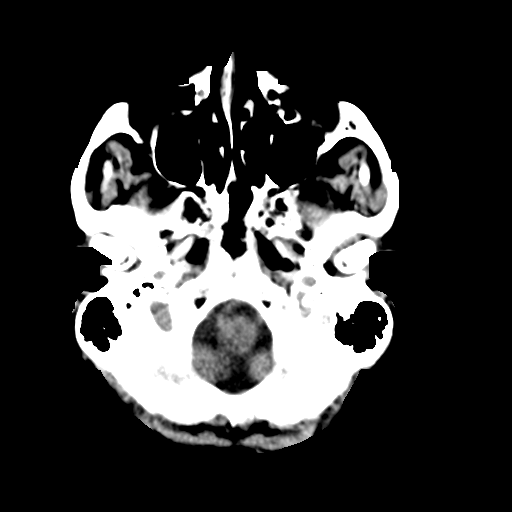
[im 4/31  bone]
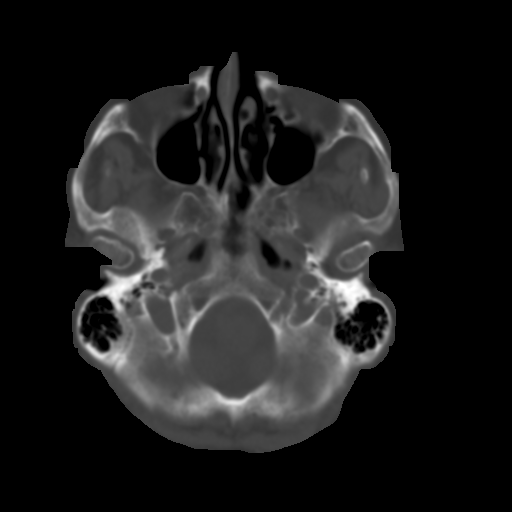
[im 8/31  brain]
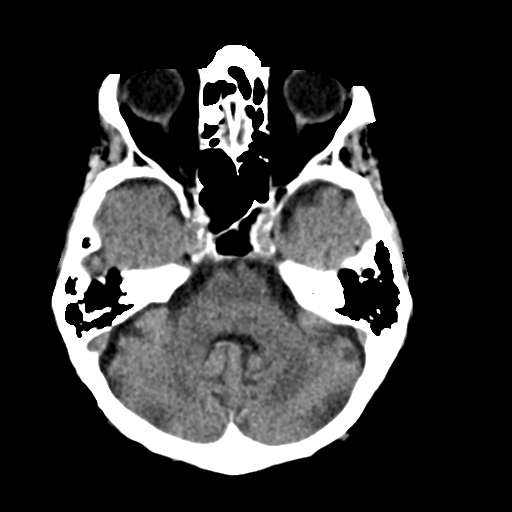
[im 12/31  brain]
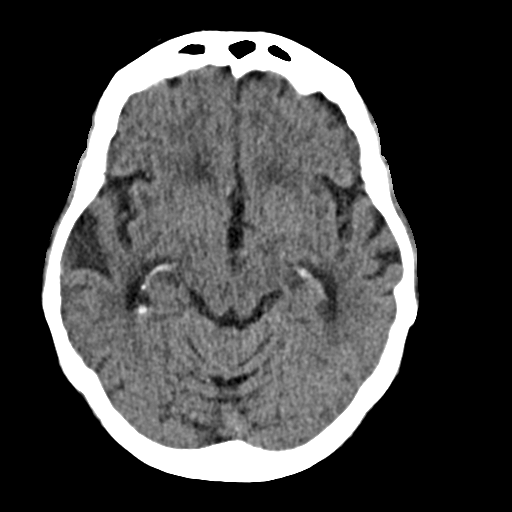
[im 16/31  brain]
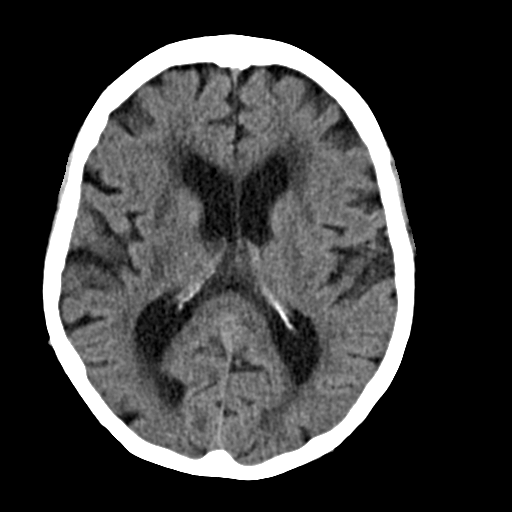
[im 19/31  brain]
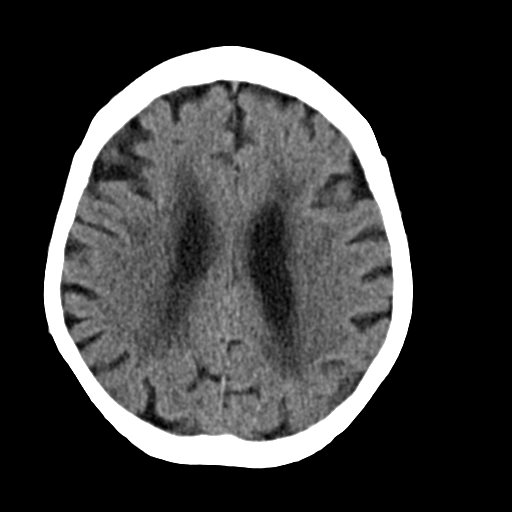
[im 19/31  bone]
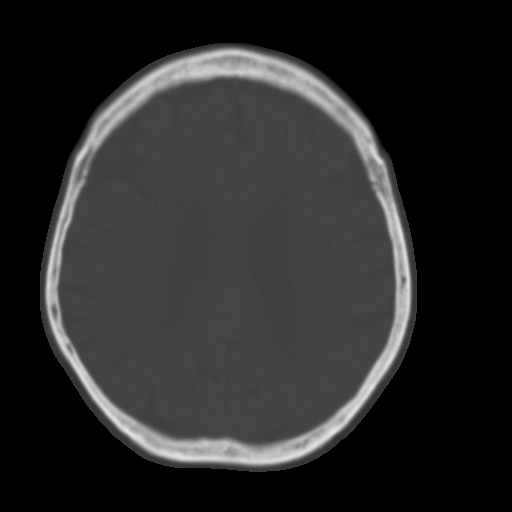
[im 23/31  brain]
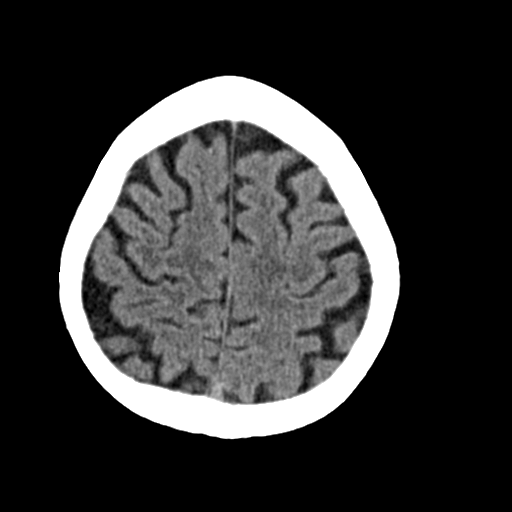
[im 27/31  brain]
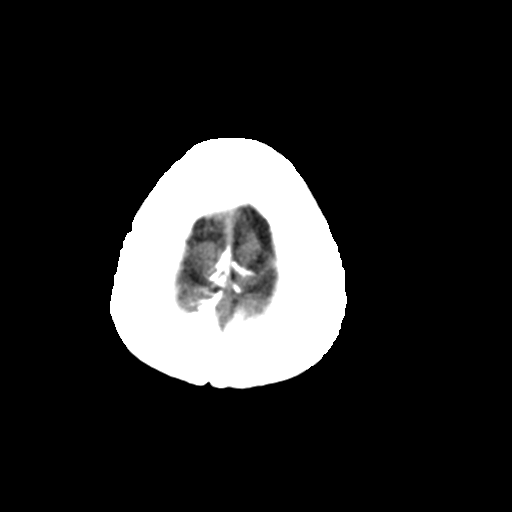

[Series 3: head bone · axial · 0.39mm/px · z∈[-222,-192]mm · 3 of 77 slices shown]
[im 8/77  bone]
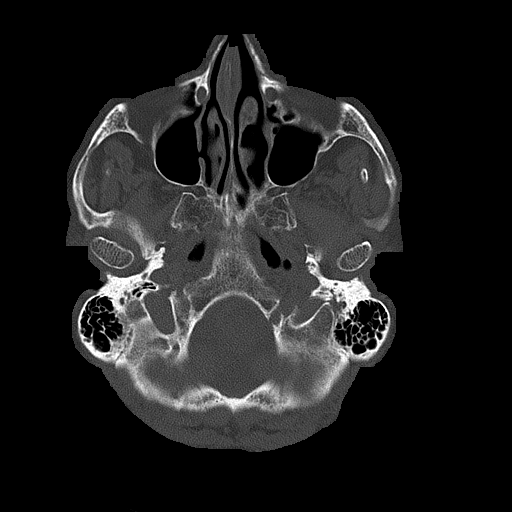
[im 16/77  bone]
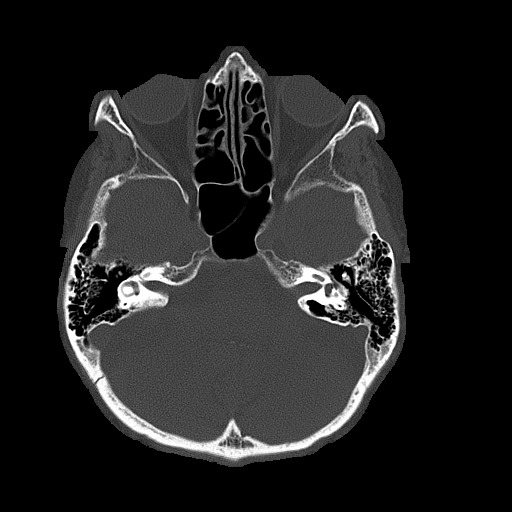
[im 23/77  bone]
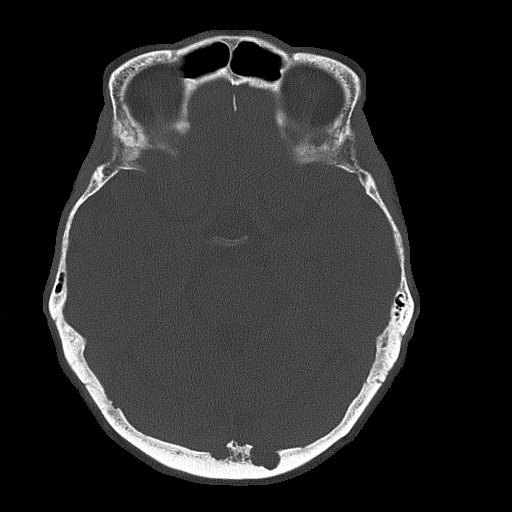

[Series 4: coronal soft · coronal · 0.29mm/px · 3 of 67 slices shown]
[im 23/67  brain]
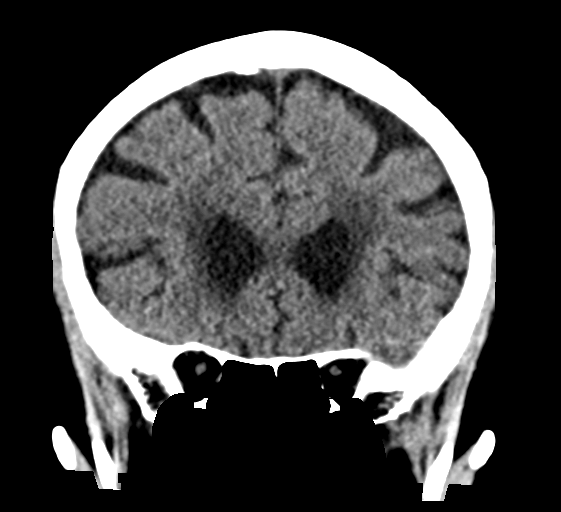
[im 30/67  brain]
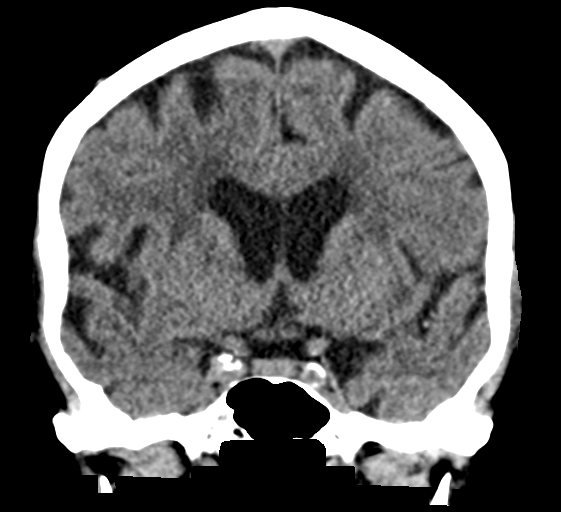
[im 37/67  brain]
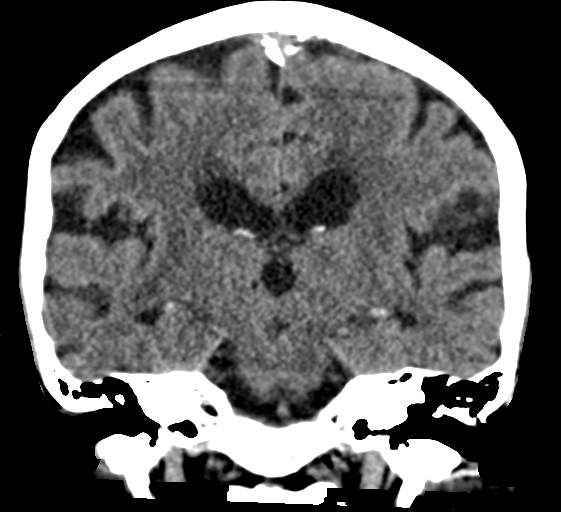

[Series 5: sag soft · sagittal · 0.29mm/px · 3 of 59 slices shown]
[im 20/59  brain]
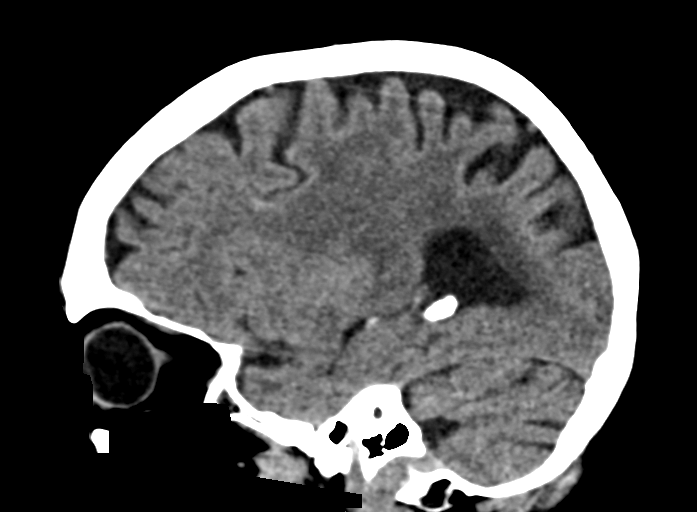
[im 30/59  brain]
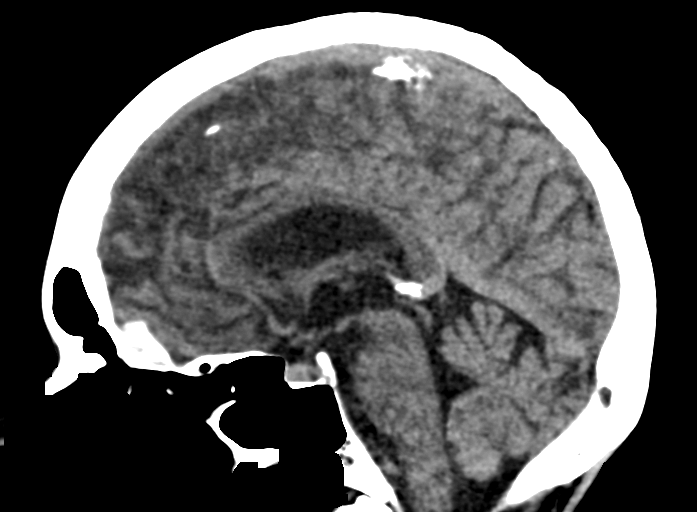
[im 39/59  brain]
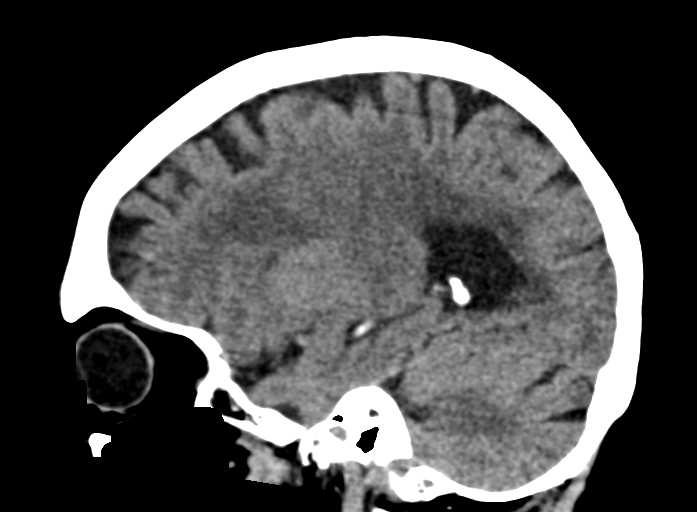

[16 of 47 positions shown; findings below may reference images not displayed]

FINDINGS: Brain: No evidence of acute infarction, hemorrhage, hydrocephalus,
extra-axial collection or mass lesion/mass effect. Periventricular
white matter hypodensities consistent with sequela of chronic
microvascular ischemic disease.

Vascular: Vascular calcifications of the carotid siphons.

Skull: Normal. Negative for fracture or focal lesion.

Sinuses/Orbits: No acute finding.

Other: None.
IMPRESSION: No acute intracranial abnormality.
